# Patient Record
Sex: Male | Born: 1980 | Race: White | Hispanic: Yes | Marital: Married | Smoking: Never smoker
Health system: Southern US, Community
[De-identification: ages and names within clinical notes are randomized; demographics above are authoritative.]

## PROBLEM LIST (undated history)

## (undated) DIAGNOSIS — E119 Type 2 diabetes mellitus without complications: Secondary | ICD-10-CM

## (undated) DIAGNOSIS — E7800 Pure hypercholesterolemia, unspecified: Secondary | ICD-10-CM

## (undated) DIAGNOSIS — I10 Essential (primary) hypertension: Secondary | ICD-10-CM

---

## 2002-05-23 ENCOUNTER — Emergency Department (HOSPITAL_COMMUNITY): Admission: EM | Admit: 2002-05-23 | Discharge: 2002-05-23 | Payer: Self-pay | Admitting: Emergency Medicine

## 2002-05-24 ENCOUNTER — Emergency Department (HOSPITAL_COMMUNITY): Admission: EM | Admit: 2002-05-24 | Discharge: 2002-05-24 | Payer: Self-pay | Admitting: Emergency Medicine

## 2003-01-20 ENCOUNTER — Emergency Department (HOSPITAL_COMMUNITY): Admission: EM | Admit: 2003-01-20 | Discharge: 2003-01-20 | Payer: Self-pay | Admitting: Emergency Medicine

## 2006-08-25 ENCOUNTER — Emergency Department (HOSPITAL_COMMUNITY): Admission: EM | Admit: 2006-08-25 | Discharge: 2006-08-25 | Payer: Self-pay | Admitting: Emergency Medicine

## 2006-08-28 ENCOUNTER — Emergency Department (HOSPITAL_COMMUNITY): Admission: EM | Admit: 2006-08-28 | Discharge: 2006-08-28 | Payer: Self-pay | Admitting: Emergency Medicine

## 2011-08-02 ENCOUNTER — Encounter (HOSPITAL_COMMUNITY): Payer: Self-pay | Admitting: Emergency Medicine

## 2011-08-02 ENCOUNTER — Emergency Department (HOSPITAL_COMMUNITY)
Admission: EM | Admit: 2011-08-02 | Discharge: 2011-08-02 | Disposition: A | Discharge status: Home / Self Care | Payer: Self-pay | Attending: Emergency Medicine | Admitting: Emergency Medicine

## 2011-08-02 DIAGNOSIS — K029 Dental caries, unspecified: Secondary | ICD-10-CM | POA: Insufficient documentation

## 2011-08-02 DIAGNOSIS — K047 Periapical abscess without sinus: Secondary | ICD-10-CM | POA: Insufficient documentation

## 2011-08-02 MED ORDER — HYDROCODONE-ACETAMINOPHEN 5-500 MG PO TABS: 1.0000 | ORAL_TABLET | Freq: Four times a day (QID) | ORAL | Status: AC | PRN

## 2011-08-02 MED ORDER — PENICILLIN V POTASSIUM 500 MG PO TABS: 500.0000 mg | ORAL_TABLET | Freq: Four times a day (QID) | ORAL | Status: AC

## 2011-08-02 MED ORDER — OXYCODONE-ACETAMINOPHEN 5-325 MG PO TABS
1.0000 | ORAL_TABLET | Freq: Once | ORAL | Status: AC
  Administered 2011-08-02: 1 via ORAL
  Filled 2011-08-02: qty 1

## 2011-08-02 MED ORDER — IBUPROFEN 600 MG PO TABS: 600.0000 mg | ORAL_TABLET | Freq: Four times a day (QID) | ORAL | Status: AC | PRN

## 2011-08-02 NOTE — ED Provider Notes (Signed)
History     CSN: [id number]  Arrival date & time 08/02/11  1914   First MD Initiated Contact with Patient 08/02/11 2018      Chief Complaint  Patient presents with  . Dental Pain  . facial abcess     (Consider location/radiation/quality/duration/timing/severity/associated sxs/prior treatment) Patient is a 31 y.o. male presenting with tooth pain. The history is provided by the patient.  Dental PainThe primary symptoms include mouth pain. Primary symptoms do not include fever or sore throat. The symptoms began yesterday. The symptoms are worsening. The symptoms occur constantly.  Additional symptoms include: gum swelling, gum tenderness, jaw pain and facial swelling. Additional symptoms do not include: trismus, trouble swallowing and ear pain.  PT with left lower tooth ache, facial swelling since yesterday. States painful to eat. Denies fever, chills, malaise. No dental injury. No other complaints.   History reviewed. No pertinent past medical history.  History reviewed. No pertinent past surgical history.  History reviewed. No pertinent family history.  History  Substance Use Topics  . Smoking status: Never Smoker   . Smokeless tobacco: Not on file  . Alcohol Use: Yes     occasional      Review of Systems  Constitutional: Negative for fever and chills.  HENT: Positive for facial swelling and dental problem. Negative for ear pain, sore throat, trouble swallowing and neck pain.   Respiratory: Negative.   Cardiovascular: Negative.   Skin: Negative.     Allergies  Review of patient's allergies indicates no known allergies.  Home Medications   Current Outpatient Rx  Name Route Sig Dispense Refill  . ACETAMINOPHEN 325 MG PO TABS Oral Take 325 mg by mouth every 6 (six) hours as needed. pain      BP 132/76  Pulse 93  Temp 98.5 F (36.9 C) (Oral)  Resp 16  SpO2 100%  Physical Exam  Nursing note and vitals reviewed. Constitutional: He appears well-developed and  well-nourished. No distress.  HENT:  Head: Normocephalic.       Poor dentition. Carries to the left lower 1st molar. Lower left jaw swelling. No obvious drainable abscess.   Eyes: Conjunctivae are normal.  Neck: Neck supple.  Cardiovascular: Normal rate, regular rhythm and normal heart sounds.   Pulmonary/Chest: Effort normal and breath sounds normal. No respiratory distress. He has no wheezes. He has no rales.  Lymphadenopathy:    He has no cervical adenopathy.  Neurological: He is alert.  Skin: Skin is warm and dry.  Psychiatric: He has a normal mood and affect.    ED Course  Procedures (including critical care time)  Pt with detal carries and probable early abscess. Will start on antibiotics, pain management, follow up with oral surgery.   1. Dental abscess       MDM          Lottie Mussel, PA 08/03/11 [phone]

## 2011-08-02 NOTE — Discharge Instructions (Signed)
I suspect your pain and swelling is due to a dental abscess. Take ibuprofen for pain. Vicodin for severe pain. Penicillin as prescribed until all gone. Follow up with oral surgery. Return if unable to follow up and symptoms are worsening.   Absceso dental  (Abscessed Tooth)  Un absceso dental es la acumulacin de lquido infectado (pus) debido a una infeccin bacteriana en la parte interna del diente (pulpa). Generalmente se produce en la punta de la raz del diente.  CAUSAS   Una cavidad extensa (caries dental grande).   Traumatismo en el diente, como un diente que se ha roto y permite el ingreso de las bacterias en la pulpa.  SNTOMAS   Dolor intenso en el interior y alrededor del diente infectado.   Hinchazn y enrojecimiento alrededor del diente, en la boca o en la cara.   Sensibilidad.   Secrecin de pus.   Mal aliento.   Gusto desagradable en la boca   Dificultad para tragar.   Dificultad para abrir Government social research officer.   Ganas de vomitar (nuseas).   Vmitos.   Escalofros.   Ganglios hinchados en el cuello.  DIAGNSTICO   Deben tomarle una historia clnica dental.   El examen consistir en punzar el absceso del diente.   Le tomarn radiografas del diente para identificar el absceso.  TRATAMIENTO  El objetivo del tratamiento es eliminar la infeccin.   Le indicarn antibiticos para evitar que la infeccin se extienda.   Para salvar el diente,el dentista har un tratamiento de conducto. Si el diente no puede salvarse, habr que sacarlo (extraerlo) y se drenar el absceso.  INSTRUCCIONES PARA EL CUIDADO EN EL HOGAR   Slo tome medicamentos de venta libre o de prescripcin para [employer], la fiebre, o el Merna, segn las indicaciones de su mdico.   No conduzca mientras toma analgsicos(narcticos).Marguarite Arbour la boca con frecuencia(haga buches) con agua y sal ( de cucharadita de sal en 240 ml. de agua tibia) para aliviar el dolor y la inflamacin.   No  aplique calor en la parte externa del rostro.   Regrese para completar el tratamiento con el dentista, segn las indicaciones  SOLICITE ATENCIN MDICA DE INMEDIATO SI:   La temperatura oral le sube a ms de 102 F (38.9 C), y no puede bajarla con medicamentos.   Siente escalofros o un dolor de cabeza muy intenso.   Tiene problemas para respirar o tragar.   Tiene dificultad para abrir Government social research officer.   Observa hinchazn en el cuello o alrededor del ojo.   El dolor no se alivia con los [employer].   El dolor empeora en vez de [place].  Document Released: 11/16/2004 Document Revised: 01/26/2011 [facility] Patient Information 2012 [place], [place].

## 2011-08-02 NOTE — ED Notes (Signed)
Patient has dental pain and swelling to his left lower jaw region

## 2011-08-04 NOTE — ED Provider Notes (Signed)
Medical screening examination/treatment/procedure(s) were performed by non-physician practitioner and as supervising physician I was immediately available for consultation/collaboration.  Limuel Nieblas L Kyan Giannone, MD 08/04/11 1008 

## 2012-02-18 ENCOUNTER — Emergency Department (HOSPITAL_COMMUNITY)
Admission: EM | Admit: 2012-02-18 | Discharge: 2012-02-19 | Disposition: A | Discharge status: Home / Self Care | Payer: Self-pay | Attending: Emergency Medicine | Admitting: Emergency Medicine

## 2012-02-18 ENCOUNTER — Encounter (HOSPITAL_COMMUNITY): Payer: Self-pay | Admitting: *Deleted

## 2012-02-18 ENCOUNTER — Emergency Department (HOSPITAL_COMMUNITY): Payer: Self-pay

## 2012-02-18 DIAGNOSIS — IMO0001 Reserved for inherently not codable concepts without codable children: Secondary | ICD-10-CM | POA: Insufficient documentation

## 2012-02-18 DIAGNOSIS — R51 Headache: Secondary | ICD-10-CM | POA: Insufficient documentation

## 2012-02-18 DIAGNOSIS — J3489 Other specified disorders of nose and nasal sinuses: Secondary | ICD-10-CM | POA: Insufficient documentation

## 2012-02-18 DIAGNOSIS — R112 Nausea with vomiting, unspecified: Secondary | ICD-10-CM | POA: Insufficient documentation

## 2012-02-18 DIAGNOSIS — R231 Pallor: Secondary | ICD-10-CM | POA: Insufficient documentation

## 2012-02-18 DIAGNOSIS — R197 Diarrhea, unspecified: Secondary | ICD-10-CM | POA: Insufficient documentation

## 2012-02-18 LAB — POCT I-STAT, CHEM 8
BUN: 23 mg/dL (ref 6–23)
Calcium, Ion: 1.08 mmol/L — ABNORMAL LOW (ref 1.12–1.23)
Chloride: 101 mEq/L (ref 96–112)
Creatinine, Ser: 0.9 mg/dL (ref 0.50–1.35)
Glucose, Bld: 127 mg/dL — ABNORMAL HIGH (ref 70–99)

## 2012-02-18 MED ORDER — SODIUM CHLORIDE 0.9 % IV BOLUS (SEPSIS)
1000.0000 mL | Freq: Once | INTRAVENOUS | Status: AC
  Administered 2012-02-18: 1000 mL via INTRAVENOUS

## 2012-02-18 NOTE — ED Notes (Signed)
The pt has had a cold and cough for the past 3 days he has a temp with chest congestion an he is coughing up  Blood in his sputum

## 2012-02-18 NOTE — ED Provider Notes (Signed)
History     CSN: [id number]  Arrival date & time 02/18/12  Barry Brunner   First MD Initiated Contact with Patient 02/18/12 2109      Chief Complaint  Patient presents with  . Cough    (Consider location/radiation/quality/duration/timing/severity/associated sxs/prior treatment) HPI Comments: Patient states, that for the past 3, days.  She's had nausea, vomiting, and diarrhea.  He is also had URI symptoms, and a nonproductive cough.  He noticed, when he vomited earlier today.  That there were some streaks of blood in his emesis.  He has no known ill contacts.  He has not taken any medication.  Prior to arrival  Patient is a 31 y.o. male presenting with cough. The history is provided by the patient.  Cough This is a new problem. The current episode started more than 2 days ago. The problem occurs every few minutes. The problem has not changed since onset.The cough is non-productive. Associated symptoms include chills, headaches and myalgias. Pertinent negatives include no sore throat and no shortness of breath.    History reviewed. No pertinent past medical history.  History reviewed. No pertinent past surgical history.  No family history on file.  History  Substance Use Topics  . Smoking status: Never Smoker   . Smokeless tobacco: Not on file  . Alcohol Use: Yes     Comment: occasional      Review of Systems  Constitutional: Positive for fever and chills.  HENT: Positive for congestion. Negative for sore throat.   Respiratory: Positive for cough. Negative for shortness of breath.   Cardiovascular: Negative.   Gastrointestinal: Positive for vomiting and diarrhea. Negative for abdominal pain.  Genitourinary: Negative for dysuria and decreased urine volume.  Musculoskeletal: Positive for myalgias.  Skin: Positive for pallor.  Neurological: Positive for headaches.    Allergies  Review of patient's allergies indicates no known allergies.  Home Medications   Current Outpatient  Rx  Name  Route  Sig  Dispense  Refill  . OVER THE COUNTER MEDICATION      4 tablets every 4 (four) hours as needed. For pain         . OVER THE COUNTER MEDICATION      1 application every 4 (four) hours as needed. For cold         . PROMETHAZINE HCL 25 MG PO TABS   Oral   Take 1 tablet (25 mg total) by mouth every 6 (six) hours as needed for nausea.   30 tablet   0     BP 129/88  Pulse 104  Temp 99.5 F (37.5 C) (Oral)  Resp 24  SpO2 100%  Physical Exam  Constitutional: He is oriented to person, place, and time. He appears well-developed and well-nourished.  HENT:  Head: Normocephalic and atraumatic.  Eyes: Pupils are equal, round, and reactive to light.  Neck: Normal range of motion.  Cardiovascular: Tachycardia present.   Pulmonary/Chest: Effort normal and breath sounds normal. He has no wheezes.  Abdominal: Soft. There is no tenderness.  Musculoskeletal: Normal range of motion. He exhibits no edema and no tenderness.  Neurological: He is alert and oriented to person, place, and time.  Skin: Skin is warm. There is pallor.    ED Course  Procedures (including critical care time)  Labs Reviewed  POCT I-STAT, CHEM 8 - Abnormal; Notable for the following:    Glucose, Bld 127 (*)     Calcium, Ion 1.08 (*)     Hemoglobin 18.0 (*)  HCT 53.0 (*)     All other components within normal limits   Dg Chest 2 View  02/18/2012  *RADIOLOGY REPORT*  Clinical Data: Cough, shortness of breath, fever, nausea and vomiting.  CHEST - 2 VIEW  Comparison: None.  Findings: The lungs are well-aerated.  Minimally increased lung markings are noted at the right midlung zone; this could reflect minimal pneumonia.  There is no evidence of pleural effusion or pneumothorax.  The heart is normal in size; the mediastinal contour is within normal limits.  No acute osseous abnormalities are seen.  IMPRESSION: Minimally increased lung markings at the right midlung zone could reflect minimal  pneumonia; lungs otherwise clear.   Original Report Authenticated By: Tonia Ghent, M.D.      1. Nausea vomiting and diarrhea       MDM   After 2 L fluids feeling better has urinated X1   1 BM no longer nausea/vomiting         Arman Filter, NP 02/19/12 [phone]

## 2012-02-19 MED ORDER — PROMETHAZINE HCL 25 MG PO TABS: 25.0000 mg | ORAL_TABLET | Freq: Four times a day (QID) | ORAL | Status: DC | PRN

## 2012-02-19 NOTE — ED Notes (Signed)
NP gave patient cup of soda,  Patient drank some before assessment and is denying any nausea at this time. Instructed patient to finish drinking the cup and patient stated that he will do that without a problem.  Patient tolerated fluids well.

## 2012-02-19 NOTE — ED Provider Notes (Signed)
Medical screening examination/treatment/procedure(s) were performed by non-physician practitioner and as supervising physician I was immediately available for consultation/collaboration.   Tobin Chad, MD 02/19/12 [phone]

## 2012-09-17 ENCOUNTER — Encounter (HOSPITAL_COMMUNITY): Payer: Self-pay | Admitting: Emergency Medicine

## 2012-09-17 ENCOUNTER — Emergency Department (HOSPITAL_COMMUNITY)
Admission: EM | Admit: 2012-09-17 | Discharge: 2012-09-17 | Disposition: A | Discharge status: Home / Self Care | Payer: Self-pay | Attending: Emergency Medicine | Admitting: Emergency Medicine

## 2012-09-17 DIAGNOSIS — X503XXA Overexertion from repetitive movements, initial encounter: Secondary | ICD-10-CM | POA: Insufficient documentation

## 2012-09-17 DIAGNOSIS — M545 Low back pain: Secondary | ICD-10-CM

## 2012-09-17 DIAGNOSIS — Y990 Civilian activity done for income or pay: Secondary | ICD-10-CM | POA: Insufficient documentation

## 2012-09-17 DIAGNOSIS — S335XXA Sprain of ligaments of lumbar spine, initial encounter: Secondary | ICD-10-CM | POA: Insufficient documentation

## 2012-09-17 DIAGNOSIS — S39012A Strain of muscle, fascia and tendon of lower back, initial encounter: Secondary | ICD-10-CM

## 2012-09-17 DIAGNOSIS — M25561 Pain in right knee: Secondary | ICD-10-CM

## 2012-09-17 DIAGNOSIS — Z79899 Other long term (current) drug therapy: Secondary | ICD-10-CM | POA: Insufficient documentation

## 2012-09-17 DIAGNOSIS — Y9289 Other specified places as the place of occurrence of the external cause: Secondary | ICD-10-CM | POA: Insufficient documentation

## 2012-09-17 DIAGNOSIS — S8990XA Unspecified injury of unspecified lower leg, initial encounter: Secondary | ICD-10-CM | POA: Insufficient documentation

## 2012-09-17 MED ORDER — HYDROCODONE-ACETAMINOPHEN 5-325 MG PO TABS: 1.0000 | ORAL_TABLET | Freq: Four times a day (QID) | ORAL | Status: DC | PRN

## 2012-09-17 MED ORDER — NAPROXEN 500 MG PO TABS: 500.0000 mg | ORAL_TABLET | Freq: Two times a day (BID) | ORAL | Status: DC | PRN

## 2012-09-17 MED ORDER — METHOCARBAMOL 750 MG PO TABS: 750.0000 mg | ORAL_TABLET | Freq: Four times a day (QID) | ORAL | Status: DC | PRN

## 2012-09-17 NOTE — ED Provider Notes (Signed)
CSN: [id number]     Arrival date & time 09/17/12  1349 History     First MD Initiated Contact with Patient 09/17/12 [phone]     Chief Complaint  Patient presents with  . Back Pain  . Knee Pain   (Consider location/radiation/quality/duration/timing/severity/associated sxs/prior Treatment) The history is provided by the patient and medical records. No language interpreter was used.    Thomas Conrad is a 32 y.o. male  With no medical hx presents to the Emergency Department complaining of gradual, persistent, progressively worsening low back pain with associated knee pain for 1 month.  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  Associated symptoms include low back pain made worse with work and only some better with rest and icy hot.  He has not tried ibuprofen or other OTC pain relievers.  Pt denies fever, chills, headache, neck pain, chest pain, SOB, abd pain, N/V/D, weakness, numbness, loss of bowel or bladder control, difficulty walking.  Pt denies trauma, falls or known injury, saddle anesthesia, gait disturbance.        History reviewed. No pertinent past medical history. History reviewed. No pertinent past surgical history. No family history on file. History  Substance Use Topics  . Smoking status: Never Smoker   . Smokeless tobacco: Not on file  . Alcohol Use: Yes     Comment: occasional    Review of Systems  Constitutional: Negative for fever, diaphoresis, appetite change, fatigue and unexpected weight change.  HENT: Negative for mouth sores, neck pain and neck stiffness.   Eyes: Negative for visual disturbance.  Respiratory: Negative for cough, chest tightness, shortness of breath and wheezing.   Cardiovascular: Negative for chest pain.  Gastrointestinal: Negative for nausea, vomiting, abdominal pain, diarrhea and constipation.  Endocrine: Negative for polydipsia, polyphagia and polyuria.  Genitourinary: Negative for dysuria, urgency, frequency and  hematuria.  Musculoskeletal: Positive for back pain and arthralgias. Negative for myalgias, joint swelling and gait problem.  Skin: Negative for rash.  Allergic/Immunologic: Negative for immunocompromised state.  Neurological: Negative for syncope, light-headedness and headaches.  Hematological: Does not bruise/bleed easily.  Psychiatric/Behavioral: Negative for sleep disturbance. The patient is not nervous/anxious.     Allergies  Review of patient's allergies indicates no known allergies.  Home Medications   Current Outpatient Rx  Name  Route  Sig  Dispense  Refill  . HYDROcodone-acetaminophen (NORCO/VICODIN) 5-325 MG per tablet   Oral   Take 1 tablet by mouth every 6 (six) hours as needed for pain (Take 1 - 2 tablets every 4 - 6 hours.).   20 tablet   0   . methocarbamol (ROBAXIN) 750 MG tablet   Oral   Take 1 tablet (750 mg total) by mouth 4 (four) times daily as needed (Take 1 tablet every 6 hours as needed for muscle spasms.).   20 tablet   0   . naproxen (NAPROSYN) 500 MG tablet   Oral   Take 1 tablet (500 mg total) by mouth 2 (two) times daily as needed.   30 tablet   0    BP 136/75  Pulse 79  Temp(Src) 98.2 F (36.8 C) (Oral)  Resp 18  SpO2 100% Physical Exam  Nursing note and vitals reviewed. Constitutional: He is oriented to person, place, and time. He appears well-developed and well-nourished. No distress.  HENT:  Head: Normocephalic and atraumatic.  Mouth/Throat: Oropharynx is clear and moist. No oropharyngeal exudate.  Eyes: Conjunctivae are normal.  Neck: Normal  range of motion. Neck supple.  Full ROM without pain  Cardiovascular: Normal rate, regular rhythm, normal heart sounds and intact distal pulses.   No murmur heard. Capillary refill < 3 sec in bilateral lower extremities  Pulmonary/Chest: Effort normal and breath sounds normal. No respiratory distress. He has no wheezes.  Abdominal: Soft. He exhibits no distension. There is no tenderness.   Musculoskeletal: Normal range of motion. He exhibits tenderness. He exhibits no edema.  Full range of motion of the T-spine and L-spine No tenderness to palpation of the spinous processes of the T-spine or L-spine Mild tenderness to palpation of the paraspinous muscles of the L-spine Full ROM of the knees bilaterally with mild crepitus under the patella with ROM, no joint line tenderness  Lymphadenopathy:    He has no cervical adenopathy.  Neurological: He is alert and oriented to person, place, and time. He has normal reflexes. He exhibits normal muscle tone. Coordination normal.  Speech is clear and goal oriented, follows commands Normal strength in upper and lower extremities bilaterally including dorsiflexion and plantar flexion, strong and equal grip strength Sensation normal to light and sharp touch Moves extremities without ataxia, coordination intact Normal gait Normal balance   Skin: Skin is warm and dry. No rash noted. He is not diaphoretic. No erythema.  No tenting of the skin  Psychiatric: He has a normal mood and affect.    ED Course   Procedures (including critical care time)  Labs Reviewed - No data to display No results found. 1. Bilateral anterior knee pain   2. Low back pain   3. Strain of lumbar region, initial encounter [847.2]     MDM  [name] presents with low back pain and bilateral knee pain for 1 month.  Patient with back pain.  No neurological deficits and normal neuro exam.  Patient can walk without difficulty but states it is mildly painful.  No loss of bowel or bladder control.  No concern for cauda equina.  No fever, night sweats, weight loss, h/o cancer, IVDU.  RICE protocol, back exercises and pain medicine indicated and discussed with patient.  Pt advised to follow up with orthopedics if symptoms of knee pain persist. Conservative therapy recommended and discussed. Patient will be dc home & is agreeable with above plan.  I have also  discussed reasons to return immediately to the ER.  Patient expresses understanding and agrees with plan.      Thomas Client Lyliana Dicenso, PA-C 09/17/12 1649

## 2012-09-17 NOTE — ED Notes (Signed)
No Injury

## 2012-09-17 NOTE — ED Notes (Signed)
Pt. Stated, I've had back and knee pain for a month

## 2012-09-18 NOTE — ED Provider Notes (Signed)
Medical screening examination/treatment/procedure(s) were performed by non-physician practitioner and as supervising physician I was immediately available for consultation/collaboration.    Cosimo Schertzer J. Aily Tzeng, MD 09/18/12 1544 

## 2013-03-05 ENCOUNTER — Emergency Department (HOSPITAL_COMMUNITY): Payer: Self-pay

## 2013-03-05 ENCOUNTER — Encounter (HOSPITAL_COMMUNITY): Payer: Self-pay | Admitting: Emergency Medicine

## 2013-03-05 ENCOUNTER — Emergency Department (HOSPITAL_COMMUNITY): Payer: No Typology Code available for payment source

## 2013-03-05 ENCOUNTER — Emergency Department (HOSPITAL_COMMUNITY)
Admission: EM | Admit: 2013-03-05 | Discharge: 2013-03-05 | Disposition: A | Discharge status: Home / Self Care | Payer: Self-pay | Attending: Emergency Medicine | Admitting: Emergency Medicine

## 2013-03-05 DIAGNOSIS — S39012A Strain of muscle, fascia and tendon of lower back, initial encounter: Secondary | ICD-10-CM

## 2013-03-05 DIAGNOSIS — S139XXA Sprain of joints and ligaments of unspecified parts of neck, initial encounter: Secondary | ICD-10-CM | POA: Insufficient documentation

## 2013-03-05 DIAGNOSIS — IMO0002 Reserved for concepts with insufficient information to code with codable children: Secondary | ICD-10-CM | POA: Insufficient documentation

## 2013-03-05 DIAGNOSIS — I10 Essential (primary) hypertension: Secondary | ICD-10-CM | POA: Insufficient documentation

## 2013-03-05 DIAGNOSIS — S0990XA Unspecified injury of head, initial encounter: Secondary | ICD-10-CM | POA: Insufficient documentation

## 2013-03-05 DIAGNOSIS — S79919A Unspecified injury of unspecified hip, initial encounter: Secondary | ICD-10-CM | POA: Insufficient documentation

## 2013-03-05 DIAGNOSIS — E119 Type 2 diabetes mellitus without complications: Secondary | ICD-10-CM | POA: Insufficient documentation

## 2013-03-05 DIAGNOSIS — Y92410 Unspecified street and highway as the place of occurrence of the external cause: Secondary | ICD-10-CM | POA: Insufficient documentation

## 2013-03-05 DIAGNOSIS — Z79899 Other long term (current) drug therapy: Secondary | ICD-10-CM | POA: Insufficient documentation

## 2013-03-05 DIAGNOSIS — S79929A Unspecified injury of unspecified thigh, initial encounter: Secondary | ICD-10-CM

## 2013-03-05 DIAGNOSIS — S161XXA Strain of muscle, fascia and tendon at neck level, initial encounter: Secondary | ICD-10-CM

## 2013-03-05 DIAGNOSIS — R42 Dizziness and giddiness: Secondary | ICD-10-CM | POA: Insufficient documentation

## 2013-03-05 DIAGNOSIS — Y9389 Activity, other specified: Secondary | ICD-10-CM | POA: Insufficient documentation

## 2013-03-05 HISTORY — DX: Essential (primary) hypertension: I10

## 2013-03-05 HISTORY — DX: Type 2 diabetes mellitus without complications: E11.9

## 2013-03-05 HISTORY — DX: Pure hypercholesterolemia, unspecified: E78.00

## 2013-03-05 LAB — POCT I-STAT, CHEM 8
BUN: 18 mg/dL (ref 6–23)
CHLORIDE: 103 meq/L (ref 96–112)
Calcium, Ion: 1.25 mmol/L — ABNORMAL HIGH (ref 1.12–1.23)
Creatinine, Ser: 0.8 mg/dL (ref 0.50–1.35)
Glucose, Bld: 93 mg/dL (ref 70–99)
HCT: 47 % (ref 39.0–52.0)
Hemoglobin: 16 g/dL (ref 13.0–17.0)
POTASSIUM: 3.8 meq/L (ref 3.7–5.3)
SODIUM: 142 meq/L (ref 137–147)
TCO2: 27 mmol/L (ref 0–100)

## 2013-03-05 LAB — CBC
HCT: 46.8 % (ref 39.0–52.0)
Hemoglobin: 16.4 g/dL (ref 13.0–17.0)
MCH: 29.8 pg (ref 26.0–34.0)
MCHC: 35 g/dL (ref 30.0–36.0)
MCV: 84.9 fL (ref 78.0–100.0)
PLATELETS: 250 10*3/uL (ref 150–400)
RBC: 5.51 MIL/uL (ref 4.22–5.81)
RDW: 12.3 % (ref 11.5–15.5)
WBC: 7.6 10*3/uL (ref 4.0–10.5)

## 2013-03-05 MED ORDER — HYDROCODONE-ACETAMINOPHEN 5-325 MG PO TABS: 1.0000 | ORAL_TABLET | ORAL | Status: AC | PRN

## 2013-03-05 MED ORDER — IOHEXOL 300 MG/ML  SOLN
80.0000 mL | Freq: Once | INTRAMUSCULAR | Status: AC | PRN
  Administered 2013-03-05: 80 mL via INTRAVENOUS

## 2013-03-05 MED ORDER — DIAZEPAM 5 MG PO TABS: 5.0000 mg | ORAL_TABLET | Freq: Two times a day (BID) | ORAL | Status: DC

## 2013-03-05 MED ORDER — MORPHINE SULFATE 4 MG/ML IJ SOLN
4.0000 mg | Freq: Once | INTRAMUSCULAR | Status: AC
  Administered 2013-03-05: 4 mg via INTRAVENOUS
  Filled 2013-03-05: qty 1
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
## 2013-03-05 NOTE — ED Provider Notes (Signed)
Medical screening examination/treatment/procedure(s) were performed by non-physician practitioner and as supervising physician I was immediately available for consultation/collaboration.  EKG Interpretation   None        Courtney F Horton, MD 03/05/13 2008 

## 2013-03-10 ENCOUNTER — Emergency Department (HOSPITAL_COMMUNITY): Payer: No Typology Code available for payment source

## 2013-03-10 ENCOUNTER — Emergency Department (HOSPITAL_COMMUNITY)
Admission: EM | Admit: 2013-03-10 | Discharge: 2013-03-10 | Disposition: A | Discharge status: Home / Self Care | Payer: No Typology Code available for payment source | Attending: Emergency Medicine | Admitting: Emergency Medicine

## 2013-03-10 ENCOUNTER — Encounter (HOSPITAL_COMMUNITY): Payer: Self-pay | Admitting: Emergency Medicine

## 2013-03-10 DIAGNOSIS — E119 Type 2 diabetes mellitus without complications: Secondary | ICD-10-CM | POA: Insufficient documentation

## 2013-03-10 DIAGNOSIS — S139XXA Sprain of joints and ligaments of unspecified parts of neck, initial encounter: Secondary | ICD-10-CM

## 2013-03-10 DIAGNOSIS — R51 Headache: Secondary | ICD-10-CM

## 2013-03-10 DIAGNOSIS — I10 Essential (primary) hypertension: Secondary | ICD-10-CM | POA: Insufficient documentation

## 2013-03-10 DIAGNOSIS — R519 Headache, unspecified: Secondary | ICD-10-CM

## 2013-03-10 MED ORDER — HYDROCODONE-ACETAMINOPHEN 5-325 MG PO TABS
2.0000 | ORAL_TABLET | Freq: Once | ORAL | Status: AC
  Administered 2013-03-10: 2 via ORAL
  Filled 2013-03-10: qty 2

## 2013-03-10 NOTE — ED Notes (Signed)
C-collar placed on pt.
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
## 2013-03-10 NOTE — Discharge Instructions (Signed)
Take your pain medication as directed. Rest and heat your neck. Refer to attached documents for more information.

## 2013-03-10 NOTE — ED Notes (Signed)
Patient transported to CT 

## 2013-03-10 NOTE — ED Notes (Signed)
Pt also reports lower back and neck pain that is tender to palpation.

## 2013-03-11 NOTE — ED Provider Notes (Signed)
Medical screening examination/treatment/procedure(s) were performed by non-physician practitioner and as supervising physician I was immediately available for consultation/collaboration.  EKG Interpretation   None         Keianna Signer S Mack Alvidrez, MD 03/11/13 1117 

## 2014-05-20 ENCOUNTER — Emergency Department (HOSPITAL_COMMUNITY)
Admission: EM | Admit: 2014-05-20 | Discharge: 2014-05-20 | Disposition: A | Discharge status: Home / Self Care | Payer: Self-pay | Attending: Emergency Medicine | Admitting: Emergency Medicine

## 2014-05-20 ENCOUNTER — Encounter (HOSPITAL_COMMUNITY): Payer: Self-pay | Admitting: Emergency Medicine

## 2014-05-20 ENCOUNTER — Emergency Department (HOSPITAL_COMMUNITY): Payer: Self-pay

## 2014-05-20 DIAGNOSIS — M79641 Pain in right hand: Secondary | ICD-10-CM | POA: Insufficient documentation

## 2014-05-20 DIAGNOSIS — I10 Essential (primary) hypertension: Secondary | ICD-10-CM | POA: Insufficient documentation

## 2014-05-20 DIAGNOSIS — Z8639 Personal history of other endocrine, nutritional and metabolic disease: Secondary | ICD-10-CM | POA: Insufficient documentation

## 2014-05-20 DIAGNOSIS — E119 Type 2 diabetes mellitus without complications: Secondary | ICD-10-CM | POA: Insufficient documentation

## 2014-05-20 MED ORDER — NAPROXEN 500 MG PO TABS: 500.0000 mg | ORAL_TABLET | Freq: Two times a day (BID) | ORAL | Status: AC

## 2014-05-20 NOTE — ED Notes (Signed)
Pt presents with right hand pain for the past 2 weeks after hitting his hand while working on a roof- no obvious deformity.

## 2014-05-20 NOTE — ED Provider Notes (Signed)
CSN: [id number]     Arrival date & time 05/20/14  2107 History  This chart was scribed for non-physician practitioner, Santiago Glad, PA-C, working with Doug Sou, MD, by Bronson Curb, ED Scribe. This patient was seen in room TR06C/TR06C and the patient's care was started at 9:30 PM.   Chief Complaint  Patient presents with  . Hand Pain    The history is provided by the patient. No language interpreter was used.     HPI Comments: Thomas Conrad is a 34 y.o. male, with history of DM, HTN, and high cholesterol, who presents to the Emergency Department complaining of sudden onset, constant, 4/10, right hand pain that began approximately 2 weeks ago. Patient states he was was working on a roof when a box fell on his right hand. He denies any pain at rest, but notes pain when trying to make a fist. Patient has not taken anything for symptom relief. He denies redness, bruising, or swelling.  Denies numbness or tingling.  No other injuries noted.   Past Medical History  Diagnosis Date  . Diabetes mellitus without complication   . Hypertension   . High cholesterol    History reviewed. No pertinent past surgical history. No family history on file. History  Substance Use Topics  . Smoking status: Never Smoker   . Smokeless tobacco: Not on file  . Alcohol Use: No     Comment: occasional    Review of Systems  Constitutional: Negative for fever.  Musculoskeletal: Positive for myalgias. Negative for joint swelling.  Skin: Negative for color change and wound.      Allergies  Review of patient's allergies indicates no known allergies.  Home Medications   Prior to Admission medications   Medication Sig Start Date End Date Taking? Authorizing Provider  HYDROcodone-acetaminophen (NORCO/VICODIN) 5-325 MG per tablet Take 1-2 tablets by mouth every 4 (four) hours as needed. 03/05/13   Robyn M Hess, PA-C  OVER THE COUNTER MEDICATION Take 5 capsules by mouth daily. Mens  Multivitamin Pack. Contains fish oil.    Historical Provider, MD   Triage Vitals: BP 139/80 mmHg  Pulse 83  Temp(Src) 98.1 F (36.7 C) (Oral)  Resp 17  Ht  (1.626 m)  Wt 145 lb (65.772 kg)  BMI 24.88 kg/m2  SpO2 99%  Physical Exam  Constitutional: He is oriented to person, place, and time. He appears well-developed and well-nourished. No distress.  HENT:  Head: Normocephalic and atraumatic.  Eyes: Conjunctivae and EOM are normal.  Neck: Neck supple. No tracheal deviation present.  Cardiovascular: Normal rate, regular rhythm and normal heart sounds.   Pulses:      Radial pulses are 2+ on the right side.  Pulmonary/Chest: Effort normal and breath sounds normal. No respiratory distress.  Musculoskeletal: Normal range of motion. He exhibits tenderness.  Tender to palpation over the right 5th metacarpal. No obvious swelling or deformity.  No erythema or warmth.   Full flexion and extension of the right hand.  2+ radial pulse on the right.  Full ROM of the right wrist.   Neurological: He is alert and oriented to person, place, and time.  Grip strength 5/5. Distal sensation of the right hand is intact.   Skin: Skin is warm and dry.  Psychiatric: He has a normal mood and affect. His behavior is normal.  Nursing note and vitals reviewed.   ED Course  Procedures (including critical care time)  DIAGNOSTIC STUDIES: Oxygen Saturation is 99% on room air, normal by  my interpretation.    COORDINATION OF CARE: At 2133 Discussed treatment plan with patient which includes imaging. Patient agrees.   Labs Review Labs Reviewed - No data to display  Imaging Review Dg Hand Complete Right  05/20/2014   CLINICAL DATA:  Heavy box fell on patient's right hand 2 weeks ago, with pain on movement of right hand. Initial encounter.  EXAM: RIGHT HAND - COMPLETE 3+ VIEW  COMPARISON:  None.  FINDINGS: There is no evidence of fracture or dislocation. The joint spaces are preserved. The carpal rows  are intact, and demonstrate normal alignment. The soft tissues are unremarkable in appearance.  IMPRESSION: No evidence of fracture or dislocation.   Electronically Signed   By: Roanna RaiderJeffery  Chang M.D.   On: 05/20/2014 21:59     EKG Interpretation None      MDM   Final diagnoses:  None   Patient presents today with right hand pain that has been present since a box fell on his hand 2 weeks ago.  Xray negative.  Patient neurovascularly intact.  No signs of infection.  Patient stable for discharge.  Return precautions given.     Santiago GladHeather Carreen Milius, PA-C 05/20/14 2216  Doug SouSam Jacubowitz, MD 05/21/14 0005

## 2016-02-16 ENCOUNTER — Ambulatory Visit: Payer: Self-pay

## 2016-02-16 ENCOUNTER — Emergency Department (HOSPITAL_COMMUNITY)
Admission: EM | Admit: 2016-02-16 | Discharge: 2016-02-16 | Disposition: A | Discharge status: Home / Self Care | Payer: Self-pay | Attending: Emergency Medicine | Admitting: Emergency Medicine

## 2016-02-16 ENCOUNTER — Encounter (HOSPITAL_COMMUNITY): Payer: Self-pay | Admitting: Emergency Medicine

## 2016-02-16 DIAGNOSIS — I10 Essential (primary) hypertension: Secondary | ICD-10-CM | POA: Insufficient documentation

## 2016-02-16 DIAGNOSIS — E1165 Type 2 diabetes mellitus with hyperglycemia: Secondary | ICD-10-CM | POA: Insufficient documentation

## 2016-02-16 DIAGNOSIS — Z79899 Other long term (current) drug therapy: Secondary | ICD-10-CM | POA: Insufficient documentation

## 2016-02-16 DIAGNOSIS — B353 Tinea pedis: Secondary | ICD-10-CM | POA: Insufficient documentation

## 2016-02-16 DIAGNOSIS — R739 Hyperglycemia, unspecified: Secondary | ICD-10-CM

## 2016-02-16 LAB — CBG MONITORING, ED: Glucose-Capillary: 142 mg/dL — ABNORMAL HIGH (ref 65–99)

## 2016-02-16 MED ORDER — CLOTRIMAZOLE 1 % EX CREA: TOPICAL_CREAM | CUTANEOUS | 0 refills | Status: AC

## 2016-02-16 NOTE — ED Triage Notes (Signed)
Pt here for body aches x 1 year and now has redness and pain to fourth toe on right foot

## 2016-02-16 NOTE — Care Management Note (Signed)
Case Management Note  Patient Details  Name: Fredrik RiggerHerman Tellez-Mendoza MRN: [id number] Date of Birth: January 04, 1981  Subjective/Objective:                  From home with spouse./ 35 y.o. male with a PMHx of HLD and HTN, who presents to the Emergency Department complaining of intermittent episodes of generalized arthralgias/myalgias onset approximately one year ago. His friend at bedside notes associated chills and diaphoresis secondary to this issue. His friend at bedside also states that "I believe he has diabetes", which he has never been evaluated for previously. No dx'd h/o DM. Pt has been checking his CBG at home, which his friend notes has been fluctuating in the mid-100s. He is not currently followed by a PCP or taking daily medications related to this.   Action/Plan: Follow for disposition needs. /Follow up appointment    Expected Discharge Date:  02/16/16               Expected Discharge Plan:  Home/Self Care  In-House Referral:  NA  Discharge planning Services  CM Consult  Post Acute Care Choice:  Home Health Choice offered to:  Patient, Spouse  DME Arranged:  N/A DME Agency:  NA  HH Arranged:  NA HH Agency:  NA  Status of Service:  Completed, signed off  If discussed at Long Length of Stay Meetings, dates discussed:    Additional Comments:  Oletta CohnWood, Christena Sunderlin, RN 02/16/2016, 3:04 PM

## 2016-02-16 NOTE — ED Provider Notes (Signed)
[facility]-EMERGENCY DEPT Provider Note   CSN: [id number] Arrival date & time: 02/16/16  1340  By signing my name below, I, Rosario AdieWilliam Andrew Hiatt, attest that this documentation has been prepared under the direction and in the presence of Gwyneth SproutWhitney Roc Streett, MD. Electronically Signed: Rosario AdieWilliam Andrew Hiatt, ED Scribe. 02/16/16. 2:44 PM.  History   Chief Complaint Chief Complaint  Patient presents with  . Generalized Body Aches  . Foot Pain   The history is provided by the patient and a friend. No language interpreter was used.    HPI Comments: Thomas Conrad is a 35 y.o. male with a PMHx of HLD and HTN, who presents to the Emergency Department complaining of intermittent episodes of generalized arthralgias/myalgias onset approximately one year ago. His friend at bedside notes associated chills and diaphoresis secondary to this issue. His friend at bedside also states that "I believe he has diabetes", which he has never been evaluated for previously. No dx'd h/o DM. Pt has been checking his CBG at home, which his friend notes has been fluctuating in the mid-100s. He is not currently followed by a PCP or taking daily medications related to this.   He is secondarily c/o right fourth toe pain w/ associated redness and swelling to the toe onset several days ago. No recent injury to the foot, however, he states that he works in [employer]. He has recently has changed footwear prior to the onset of his pain. He denies fever, or any other associated symptoms.   Past Medical History:  Diagnosis Date  . Diabetes mellitus without complication (HCC)   . High cholesterol   . Hypertension    There are no active problems to display for this patient.  History reviewed. No pertinent surgical history.  Home Medications    Prior to Admission medications   Medication Sig Start Date End Date Taking? Authorizing Provider  HYDROcodone-acetaminophen (NORCO/VICODIN) 5-325 MG per tablet Take 1-2 tablets by  mouth every 4 (four) hours as needed. 03/05/13   Robyn M Hess, PA-C  naproxen (NAPROSYN) 500 MG tablet Take 1 tablet (500 mg total) by mouth 2 (two) times daily. 05/20/14   Heather Laisure, PA-C  OVER THE COUNTER MEDICATION Take 5 capsules by mouth daily. Mens Multivitamin Pack. Contains fish oil.    Historical Provider, MD   Family History History reviewed. No pertinent family history.  Social History Social History  Substance Use Topics  . Smoking status: Never Smoker  . Smokeless tobacco: Not on file  . Alcohol use No     Comment: occasional   Allergies   Patient has no known allergies.  Review of Systems Review of Systems  Constitutional: Positive for chills and diaphoresis. Negative for fever.  Musculoskeletal: Positive for arthralgias (generalized), joint swelling (right fourth toe) and myalgias (generalized, right fourth toe).  Skin: Positive for color change.  All other systems reviewed and are negative.  Physical Exam Updated Vital Signs BP 134/76 (BP Location: Right Arm)   Pulse 76   Temp 98.5 F (36.9 C) (Oral)   Resp 18   SpO2 100%   Physical Exam  Constitutional: He appears well-developed and well-nourished. No distress.  HENT:  Head: Normocephalic and atraumatic.  Right Ear: Tympanic membrane and external ear normal.  Left Ear: Tympanic membrane and external ear normal.  Nose: Nose normal.  Mouth/Throat: Oropharynx is clear and moist. No oropharyngeal exudate.  Eyes: Conjunctivae are normal.  Neck: Normal range of motion.  Cardiovascular: Normal rate, regular rhythm and normal heart sounds.  No murmur heard. Pulmonary/Chest: Effort normal and breath sounds normal. No respiratory distress. He has no wheezes. He has no rales.  Abdominal: He exhibits no distension.  Musculoskeletal: Normal range of motion. He exhibits tenderness.  Bilateral feet with scaly, crusty skin over the plantar surfaces of both feet. The right fourth toe with erythema, tenderness,  and a blister present at the base of the nail without drainage.   Neurological: He is alert.  Skin: Skin is warm. There is erythema. No pallor.  Psychiatric: He has a normal mood and affect. His behavior is normal.  Nursing note and vitals reviewed.  ED Treatments / Results  DIAGNOSTIC STUDIES: Oxygen Saturation is 100% on RA, normal by my interpretation.   COORDINATION OF CARE: 2:44 PM-Discussed next steps with pt. Pt verbalized understanding and is agreeable with the plan.   Labs (all labs ordered are listed, but only abnormal results are displayed) Labs Reviewed  CBG MONITORING, ED - Abnormal; Notable for the following:       Result Value   Glucose-Capillary 142 (*)    All other components within normal limits   EKG  EKG Interpretation None      Radiology No results found.  Procedures Procedures   Medications Ordered in ED Medications - No data to display  Initial Impression / Assessment and Plan / ED Course  I have reviewed the triage vital signs and the nursing notes.  Pertinent labs & imaging results that were available during my care of the patient were reviewed by me and considered in my medical decision making (see chart for details).  Clinical Course    Patient with no prior medical history presenting today with elevated blood sugars for the last year per his significant other who is also in the room. He is having no acute issues today but was unable to get an appointment with her regular doctor so came here for further evaluation. Patient's blood sugar is 142 nonfasting here. Apparently when checked at home it runs around 170. He is complaining of some mild toe pain that started after changing his work boots and he has evidence of tinea pedis. There are no signs of diabetic foot wounds, abscesses or other issues. Spoke with care management who got patient a follow-up appointment [facility] and he will also start using Lamisil.  Final Clinical  Impressions(s) / ED Diagnoses   Final diagnoses:  Hyperglycemia  Tinea pedis of both feet   New Prescriptions Discharge Medication List as of 02/16/2016  3:10 PM    START taking these medications   Details  clotrimazole (LOTRIMIN) 1 % cream Apply to affected area 2 times daily, Print       I personally performed the services described in this documentation, which was scribed in my presence.  The recorded information has been reviewed and considered.     Gwyneth SproutWhitney Elma Shands, MD 02/16/16 [phone]

## 2016-02-18 ENCOUNTER — Ambulatory Visit: Payer: Self-pay

## 2016-03-03 ENCOUNTER — Ambulatory Visit: Payer: Self-pay | Admitting: Family Medicine

## 2019-12-11 ENCOUNTER — Emergency Department (HOSPITAL_COMMUNITY): Payer: No Typology Code available for payment source

## 2019-12-11 ENCOUNTER — Emergency Department (HOSPITAL_COMMUNITY)
Admission: EM | Admit: 2019-12-11 | Discharge: 2019-12-12 | Disposition: A | Discharge status: Home / Self Care | Payer: No Typology Code available for payment source | Attending: Emergency Medicine | Admitting: Emergency Medicine

## 2019-12-11 ENCOUNTER — Other Ambulatory Visit: Payer: Self-pay

## 2019-12-11 DIAGNOSIS — M25562 Pain in left knee: Secondary | ICD-10-CM | POA: Diagnosis not present

## 2019-12-11 DIAGNOSIS — I10 Essential (primary) hypertension: Secondary | ICD-10-CM | POA: Insufficient documentation

## 2019-12-11 DIAGNOSIS — M542 Cervicalgia: Secondary | ICD-10-CM | POA: Insufficient documentation

## 2019-12-11 DIAGNOSIS — E119 Type 2 diabetes mellitus without complications: Secondary | ICD-10-CM | POA: Insufficient documentation

## 2019-12-11 DIAGNOSIS — M549 Dorsalgia, unspecified: Secondary | ICD-10-CM | POA: Diagnosis not present

## 2019-12-11 DIAGNOSIS — R55 Syncope and collapse: Secondary | ICD-10-CM | POA: Diagnosis not present

## 2019-12-11 DIAGNOSIS — Y92481 Parking lot as the place of occurrence of the external cause: Secondary | ICD-10-CM | POA: Diagnosis not present

## 2019-12-11 DIAGNOSIS — T1490XA Injury, unspecified, initial encounter: Secondary | ICD-10-CM

## 2019-12-11 DIAGNOSIS — M25522 Pain in left elbow: Secondary | ICD-10-CM | POA: Insufficient documentation

## 2019-12-11 LAB — CBC WITH DIFFERENTIAL/PLATELET
Abs Immature Granulocytes: 0.03 10*3/uL (ref 0.00–0.07)
Basophils Absolute: 0.1 10*3/uL (ref 0.0–0.1)
Basophils Relative: 1 %
Eosinophils Absolute: 0.3 10*3/uL (ref 0.0–0.5)
Eosinophils Relative: 4 %
HCT: 45.8 % (ref 39.0–52.0)
Hemoglobin: 14.9 g/dL (ref 13.0–17.0)
Immature Granulocytes: 0 %
Lymphocytes Relative: 26 %
Lymphs Abs: 2.3 10*3/uL (ref 0.7–4.0)
MCH: 28.4 pg (ref 26.0–34.0)
MCHC: 32.5 g/dL (ref 30.0–36.0)
MCV: 87.4 fL (ref 80.0–100.0)
Monocytes Absolute: 0.9 10*3/uL (ref 0.1–1.0)
Monocytes Relative: 10 %
Neutro Abs: 5.5 10*3/uL (ref 1.7–7.7)
Neutrophils Relative %: 59 %
Platelets: 286 10*3/uL (ref 150–400)
RBC: 5.24 MIL/uL (ref 4.22–5.81)
RDW: 12.4 % (ref 11.5–15.5)
WBC: 9.2 10*3/uL (ref 4.0–10.5)
nRBC: 0 % (ref 0.0–0.2)

## 2019-12-11 LAB — COMPREHENSIVE METABOLIC PANEL
ALT: 36 U/L (ref 0–44)
AST: 29 U/L (ref 15–41)
Albumin: 3.9 g/dL (ref 3.5–5.0)
Alkaline Phosphatase: 58 U/L (ref 38–126)
Anion gap: 8 (ref 5–15)
BUN: 14 mg/dL (ref 6–20)
CO2: 24 mmol/L (ref 22–32)
Calcium: 8.9 mg/dL (ref 8.9–10.3)
Chloride: 106 mmol/L (ref 98–111)
Creatinine, Ser: 0.89 mg/dL (ref 0.61–1.24)
GFR, Estimated: >60 mL/min (ref >60)
Glucose, Bld: 128 mg/dL — ABNORMAL HIGH (ref 70–99)
Potassium: 3.8 mmol/L (ref 3.5–5.1)
Sodium: 138 mmol/L (ref 135–145)
Total Bilirubin: 0.4 mg/dL (ref 0.3–1.2)
Total Protein: 6.8 g/dL (ref 6.5–8.1)

## 2019-12-11 MED ORDER — ONDANSETRON HCL 4 MG/2ML IJ SOLN
4.0000 mg | Freq: Once | INTRAMUSCULAR | Status: AC
  Administered 2019-12-11: 4 mg via INTRAVENOUS
  Filled 2019-12-11: qty 2

## 2019-12-11 MED ORDER — FENTANYL CITRATE (PF) 100 MCG/2ML IJ SOLN
50.0000 ug | Freq: Once | INTRAMUSCULAR | Status: AC
  Administered 2019-12-11: 50 ug via INTRAVENOUS
  Filled 2019-12-11: qty 2
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
## 2019-12-12 MED ORDER — METHOCARBAMOL 500 MG PO TABS: 500.0000 mg | ORAL_TABLET | Freq: Two times a day (BID) | ORAL | 0 refills | Status: DC

## 2019-12-12 MED ORDER — IOHEXOL 300 MG/ML  SOLN
100.0000 mL | Freq: Once | INTRAMUSCULAR | Status: AC | PRN
  Administered 2019-12-12: 100 mL via INTRAVENOUS

## 2019-12-12 MED ORDER — IBUPROFEN 600 MG PO TABS: 600.0000 mg | ORAL_TABLET | Freq: Four times a day (QID) | ORAL | 0 refills | Status: AC | PRN
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
## 2023-11-09 ENCOUNTER — Emergency Department (HOSPITAL_COMMUNITY)

## 2023-11-09 ENCOUNTER — Emergency Department (HOSPITAL_COMMUNITY)
Admission: EM | Admit: 2023-11-09 | Discharge: 2023-11-10 | Disposition: A | Discharge status: Home / Self Care | Attending: Emergency Medicine | Admitting: Emergency Medicine

## 2023-11-09 ENCOUNTER — Other Ambulatory Visit: Payer: Self-pay

## 2023-11-09 DIAGNOSIS — M546 Pain in thoracic spine: Secondary | ICD-10-CM | POA: Insufficient documentation

## 2023-11-09 DIAGNOSIS — Y92410 Unspecified street and highway as the place of occurrence of the external cause: Secondary | ICD-10-CM | POA: Diagnosis not present

## 2023-11-09 DIAGNOSIS — R519 Headache, unspecified: Secondary | ICD-10-CM | POA: Diagnosis not present

## 2023-11-09 DIAGNOSIS — S301XXA Contusion of abdominal wall, initial encounter: Secondary | ICD-10-CM | POA: Diagnosis not present

## 2023-11-09 DIAGNOSIS — S40012A Contusion of left shoulder, initial encounter: Secondary | ICD-10-CM | POA: Diagnosis not present

## 2023-11-09 DIAGNOSIS — M545 Low back pain, unspecified: Secondary | ICD-10-CM | POA: Diagnosis not present

## 2023-11-09 DIAGNOSIS — M79602 Pain in left arm: Secondary | ICD-10-CM | POA: Diagnosis not present

## 2023-11-09 DIAGNOSIS — E119 Type 2 diabetes mellitus without complications: Secondary | ICD-10-CM | POA: Insufficient documentation

## 2023-11-09 DIAGNOSIS — I10 Essential (primary) hypertension: Secondary | ICD-10-CM | POA: Diagnosis not present

## 2023-11-09 DIAGNOSIS — R0789 Other chest pain: Secondary | ICD-10-CM | POA: Diagnosis not present

## 2023-11-09 DIAGNOSIS — M542 Cervicalgia: Secondary | ICD-10-CM | POA: Diagnosis not present

## 2023-11-09 DIAGNOSIS — S3991XA Unspecified injury of abdomen, initial encounter: Secondary | ICD-10-CM | POA: Diagnosis present

## 2023-11-09 LAB — I-STAT CHEM 8, ED
BUN: 18 mg/dL (ref 6–20)
Calcium, Ion: 1.18 mmol/L (ref 1.15–1.40)
Chloride: 106 mmol/L (ref 98–111)
Creatinine, Ser: 0.8 mg/dL (ref 0.61–1.24)
Glucose, Bld: 110 mg/dL — ABNORMAL HIGH (ref 70–99)
HCT: 48 % (ref 39.0–52.0)
Hemoglobin: 16.3 g/dL (ref 13.0–17.0)
Potassium: 3.9 mmol/L (ref 3.5–5.1)
Sodium: 141 mmol/L (ref 135–145)
TCO2: 24 mmol/L (ref 22–32)

## 2023-11-09 LAB — BASIC METABOLIC PANEL WITH GFR
Anion gap: 14 (ref 5–15)
BUN: 16 mg/dL (ref 6–20)
CO2: 22 mmol/L (ref 22–32)
Calcium: 9.8 mg/dL (ref 8.9–10.3)
Chloride: 104 mmol/L (ref 98–111)
Creatinine, Ser: 0.73 mg/dL (ref 0.61–1.24)
GFR, Estimated: >60 mL/min (ref >60)
Glucose, Bld: 108 mg/dL — ABNORMAL HIGH (ref 70–99)
Potassium: 3.9 mmol/L (ref 3.5–5.1)
Sodium: 140 mmol/L (ref 135–145)

## 2023-11-09 LAB — CBC
HCT: 48.7 % (ref 39.0–52.0)
Hemoglobin: 15.5 g/dL (ref 13.0–17.0)
MCH: 27.4 pg (ref 26.0–34.0)
MCHC: 31.8 g/dL (ref 30.0–36.0)
MCV: 86.2 fL (ref 80.0–100.0)
Platelets: 298 K/uL (ref 150–400)
RBC: 5.65 MIL/uL (ref 4.22–5.81)
RDW: 12.7 % (ref 11.5–15.5)
WBC: 11.4 K/uL — ABNORMAL HIGH (ref 4.0–10.5)
nRBC: 0 % (ref 0.0–0.2)

## 2023-11-09 MED ORDER — IOHEXOL 300 MG/ML  SOLN
100.0000 mL | Freq: Once | INTRAMUSCULAR | Status: AC | PRN
  Administered 2023-11-09: 100 mL via INTRAVENOUS

## 2023-11-09 MED ORDER — MORPHINE SULFATE (PF) 4 MG/ML IV SOLN
4.0000 mg | Freq: Once | INTRAVENOUS | Status: AC
  Administered 2023-11-09: 4 mg via INTRAVENOUS
  Filled 2023-11-09: qty 1
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
## 2023-11-10 MED ORDER — METHOCARBAMOL 500 MG PO TABS: 500.0000 mg | ORAL_TABLET | Freq: Three times a day (TID) | ORAL | 0 refills | Status: AC | PRN
